# Patient Record
Sex: Female | Born: 1970 | Race: White | Hispanic: No | Marital: Married | Smoking: Never smoker
Health system: Southern US, Community
[De-identification: ages and names within clinical notes are randomized; demographics above are authoritative.]

---

## 1999-02-15 ENCOUNTER — Other Ambulatory Visit: Admission: RE | Admit: 1999-02-15 | Discharge: 1999-02-15 | Payer: Self-pay | Admitting: Obstetrics and Gynecology

## 1999-08-12 ENCOUNTER — Other Ambulatory Visit: Admission: RE | Admit: 1999-08-12 | Discharge: 1999-08-12 | Payer: Self-pay | Admitting: Obstetrics and Gynecology

## 2000-05-02 ENCOUNTER — Other Ambulatory Visit: Admission: RE | Admit: 2000-05-02 | Discharge: 2000-05-02 | Payer: Self-pay | Admitting: *Deleted

## 2000-11-03 ENCOUNTER — Other Ambulatory Visit: Admission: RE | Admit: 2000-11-03 | Discharge: 2000-11-03 | Payer: Self-pay | Admitting: Obstetrics and Gynecology

## 2002-02-12 ENCOUNTER — Other Ambulatory Visit: Admission: RE | Admit: 2002-02-12 | Discharge: 2002-02-12 | Payer: Self-pay | Admitting: *Deleted

## 2002-08-16 ENCOUNTER — Other Ambulatory Visit: Admission: RE | Admit: 2002-08-16 | Discharge: 2002-08-16 | Payer: Self-pay | Admitting: Obstetrics and Gynecology

## 2003-08-25 ENCOUNTER — Other Ambulatory Visit: Admission: RE | Admit: 2003-08-25 | Discharge: 2003-08-25 | Payer: Self-pay | Admitting: Obstetrics and Gynecology

## 2003-12-24 ENCOUNTER — Other Ambulatory Visit: Admission: RE | Admit: 2003-12-24 | Discharge: 2003-12-24 | Payer: Self-pay | Admitting: Dermatology

## 2004-08-14 ENCOUNTER — Inpatient Hospital Stay (HOSPITAL_COMMUNITY): Admission: AD | Admit: 2004-08-14 | Discharge: 2004-08-14 | Payer: Self-pay | Admitting: Obstetrics and Gynecology

## 2004-08-23 ENCOUNTER — Encounter (INDEPENDENT_AMBULATORY_CARE_PROVIDER_SITE_OTHER): Payer: Self-pay | Admitting: *Deleted

## 2004-08-23 ENCOUNTER — Inpatient Hospital Stay (HOSPITAL_COMMUNITY): Admission: AD | Admit: 2004-08-23 | Discharge: 2004-08-25 | Payer: Self-pay | Admitting: Obstetrics and Gynecology

## 2004-09-02 ENCOUNTER — Ambulatory Visit: Admission: RE | Admit: 2004-09-02 | Discharge: 2004-09-02 | Payer: Self-pay | Admitting: Obstetrics and Gynecology

## 2004-09-16 ENCOUNTER — Encounter: Admission: RE | Admit: 2004-09-16 | Discharge: 2004-10-16 | Payer: Self-pay | Admitting: Obstetrics and Gynecology

## 2004-10-04 ENCOUNTER — Other Ambulatory Visit: Admission: RE | Admit: 2004-10-04 | Discharge: 2004-10-04 | Payer: Self-pay | Admitting: Obstetrics and Gynecology

## 2004-11-01 ENCOUNTER — Ambulatory Visit: Payer: Self-pay | Admitting: Family Medicine

## 2005-08-18 ENCOUNTER — Ambulatory Visit (HOSPITAL_COMMUNITY): Admission: RE | Admit: 2005-08-18 | Discharge: 2005-08-18 | Payer: Self-pay | Admitting: Obstetrics and Gynecology

## 2005-09-13 ENCOUNTER — Other Ambulatory Visit: Admission: RE | Admit: 2005-09-13 | Discharge: 2005-09-13 | Payer: Self-pay | Admitting: Obstetrics and Gynecology

## 2006-02-15 ENCOUNTER — Ambulatory Visit: Payer: Self-pay | Admitting: Gynecology

## 2006-02-22 ENCOUNTER — Ambulatory Visit: Payer: Self-pay | Admitting: Gynecology

## 2006-03-01 ENCOUNTER — Ambulatory Visit: Payer: Self-pay | Admitting: Gynecology

## 2006-03-07 ENCOUNTER — Ambulatory Visit: Payer: Self-pay | Admitting: Obstetrics and Gynecology

## 2006-03-14 ENCOUNTER — Ambulatory Visit: Payer: Self-pay | Admitting: Obstetrics and Gynecology

## 2006-03-20 ENCOUNTER — Inpatient Hospital Stay (HOSPITAL_COMMUNITY): Admission: AD | Admit: 2006-03-20 | Discharge: 2006-03-20 | Payer: Self-pay | Admitting: Obstetrics and Gynecology

## 2006-03-21 ENCOUNTER — Ambulatory Visit: Payer: Self-pay | Admitting: Obstetrics and Gynecology

## 2006-03-28 ENCOUNTER — Inpatient Hospital Stay (HOSPITAL_COMMUNITY): Admission: AD | Admit: 2006-03-28 | Discharge: 2006-03-30 | Payer: Self-pay | Admitting: Obstetrics and Gynecology

## 2006-03-28 ENCOUNTER — Ambulatory Visit: Payer: Self-pay | Admitting: Obstetrics and Gynecology

## 2009-01-29 ENCOUNTER — Ambulatory Visit: Payer: Self-pay | Admitting: Sports Medicine

## 2009-01-29 DIAGNOSIS — M25559 Pain in unspecified hip: Secondary | ICD-10-CM | POA: Insufficient documentation

## 2009-01-29 DIAGNOSIS — M722 Plantar fascial fibromatosis: Secondary | ICD-10-CM

## 2009-01-30 DIAGNOSIS — M2170 Unequal limb length (acquired), unspecified site: Secondary | ICD-10-CM | POA: Insufficient documentation

## 2009-03-16 ENCOUNTER — Ambulatory Visit: Payer: Self-pay | Admitting: Sports Medicine

## 2009-04-02 ENCOUNTER — Encounter: Payer: Self-pay | Admitting: Sports Medicine

## 2009-04-14 ENCOUNTER — Ambulatory Visit: Payer: Self-pay | Admitting: Sports Medicine

## 2009-10-05 ENCOUNTER — Ambulatory Visit (HOSPITAL_COMMUNITY): Admission: RE | Admit: 2009-10-05 | Discharge: 2009-10-05 | Payer: Self-pay | Admitting: Obstetrics and Gynecology

## 2010-11-21 LAB — CBC
Hemoglobin: 14.5 g/dL (ref 12.0–15.0)
MCHC: 33.6 g/dL (ref 30.0–36.0)
RBC: 4.84 MIL/uL (ref 3.87–5.11)
WBC: 7 10*3/uL (ref 4.0–10.5)

## 2010-11-21 LAB — PREGNANCY, URINE: Preg Test, Ur: NEGATIVE

## 2011-04-15 ENCOUNTER — Ambulatory Visit (INDEPENDENT_AMBULATORY_CARE_PROVIDER_SITE_OTHER): Payer: Managed Care, Other (non HMO) | Admitting: Sports Medicine

## 2011-04-15 ENCOUNTER — Encounter: Payer: Self-pay | Admitting: Sports Medicine

## 2011-04-15 VITALS — BP 128/76 | HR 52 | Ht 65.5 in | Wt 140.0 lb

## 2011-04-15 DIAGNOSIS — M25562 Pain in left knee: Secondary | ICD-10-CM

## 2011-04-15 DIAGNOSIS — M25569 Pain in unspecified knee: Secondary | ICD-10-CM

## 2011-04-15 NOTE — Progress Notes (Signed)
  Subjective:    Patient ID: Christy Dennis, female    DOB: Jan 23, 1971, 40 y.o.   MRN: [id number]  HPI 1. Left knee pain. Patient has recently begun training for her first marathon. For past few weeks, noticing pain in her anterolateral left knee that starts after she reaches mile 7-9 on her long run days. At times it radiates to anterior tibial region, but not usually. This pain resolves after finishing her run and does not return until she reaches the same mileage in her next run. Also noticing some left great toe pain on lateral aspect that similarly onset when she increased her mileage, worsens with extension. Recently purchased new running shoes but this has not helped.  Has a history of patellofemoral syndrome and leg length discrepency.   Review of Systems Denies trauma, effusion, pain at rest, joint instability.    Objective:   Physical Exam  Vitals reviewed. Constitutional: She is oriented to person, place, and time. She appears well-developed and well-nourished. No distress.  HENT:  Head: Normocephalic and atraumatic.  Musculoskeletal: Normal range of motion.       Mild TTP over inferolateral aspect patella at patellar tendon margin. Full ROM bilaterally intact. Negative for ligamental laxity, no effusion, negative mcmurray testing.   On standing has slightly diminished L>R longitudinal plantar arches.  Left great toe with hallucis stiffness. No bony deformity.  Left hip abduction 4/5, right is 5/5.   Neurological: She is alert and oriented to person, place, and time. Coordination normal.   Running gait looks within normal limits        Assessment & Plan:

## 2011-04-15 NOTE — Assessment & Plan Note (Signed)
Likely overuse secondary to poor biomechanics with weak hip abduction and slightly flat left arch. Recommended daily hip abduction exercises and placed sport insoles for support in running shoes today. Follow up prn if symptoms not improved in next few weeks to consider custom orthotics.

## 2011-10-11 ENCOUNTER — Ambulatory Visit: Payer: Managed Care, Other (non HMO) | Admitting: Sports Medicine

## 2011-10-12 ENCOUNTER — Ambulatory Visit: Payer: Managed Care, Other (non HMO) | Admitting: Sports Medicine

## 2011-10-13 ENCOUNTER — Ambulatory Visit (INDEPENDENT_AMBULATORY_CARE_PROVIDER_SITE_OTHER): Payer: Managed Care, Other (non HMO) | Admitting: Sports Medicine

## 2011-10-13 VITALS — BP 108/62

## 2011-10-13 DIAGNOSIS — M2020 Hallux rigidus, unspecified foot: Secondary | ICD-10-CM

## 2011-10-13 DIAGNOSIS — M79672 Pain in left foot: Secondary | ICD-10-CM

## 2011-10-13 DIAGNOSIS — M79609 Pain in unspecified limb: Secondary | ICD-10-CM

## 2011-10-13 NOTE — Progress Notes (Signed)
  Subjective:    Patient ID: Christy [place], female    DOB: [birth year], 41 y.o.   MRN: [id number]  HPI  L foot pain- proximal arch, but not heel since the end of oct, but worsened in January. Ran [employer] marathon in Oct, has not been running as regularly since then.   We saw her approximately 3 years ago but her plantar fasciitis at that time was on the right. We did note her unequal leg length but she has some mild scoliosis which compensates so that her gait was very neutral.  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

## 2011-10-13 NOTE — Assessment & Plan Note (Addendum)
We will use scaphoid pads along with sports insoles and a arch strap.  Restart an exercise program to strengthen the arch and foot.  Recheck in 6 weeks. Should she not improve I do think we need to go ahead with custom orthotics since she has had some chronic foot pain bilaterally.

## 2011-11-23 ENCOUNTER — Ambulatory Visit: Payer: Managed Care, Other (non HMO) | Admitting: Sports Medicine

## 2011-11-28 ENCOUNTER — Other Ambulatory Visit: Payer: Self-pay | Admitting: Obstetrics and Gynecology

## 2011-11-28 DIAGNOSIS — N6009 Solitary cyst of unspecified breast: Secondary | ICD-10-CM

## 2011-11-30 ENCOUNTER — Ambulatory Visit
Admission: RE | Admit: 2011-11-30 | Discharge: 2011-11-30 | Disposition: A | Discharge status: Home / Self Care | Payer: Managed Care, Other (non HMO) | Source: Ambulatory Visit | Attending: Obstetrics and Gynecology | Admitting: Obstetrics and Gynecology

## 2011-11-30 DIAGNOSIS — N6009 Solitary cyst of unspecified breast: Secondary | ICD-10-CM

## 2011-12-14 ENCOUNTER — Ambulatory Visit: Payer: Managed Care, Other (non HMO) | Admitting: Sports Medicine

## 2012-03-22 ENCOUNTER — Ambulatory Visit: Payer: Managed Care, Other (non HMO) | Admitting: Sports Medicine

## 2012-05-17 ENCOUNTER — Other Ambulatory Visit: Payer: Self-pay | Admitting: Family Medicine

## 2012-05-17 ENCOUNTER — Ambulatory Visit
Admission: RE | Admit: 2012-05-17 | Discharge: 2012-05-17 | Disposition: A | Discharge status: Home / Self Care | Payer: 59 | Source: Ambulatory Visit | Attending: Family Medicine | Admitting: Family Medicine

## 2012-05-17 DIAGNOSIS — R1032 Left lower quadrant pain: Secondary | ICD-10-CM

## 2012-05-17 MED ORDER — IOHEXOL 300 MG/ML  SOLN
30.0000 mL | Freq: Once | INTRAMUSCULAR | Status: AC | PRN
  Administered 2012-05-17: 30 mL via ORAL

## 2012-05-17 MED ORDER — IOHEXOL 300 MG/ML  SOLN
100.0000 mL | Freq: Once | INTRAMUSCULAR | Status: AC | PRN
  Administered 2012-05-17: 100 mL via INTRAVENOUS

## 2012-11-14 ENCOUNTER — Other Ambulatory Visit: Payer: Self-pay | Admitting: Obstetrics and Gynecology

## 2012-11-30 ENCOUNTER — Ambulatory Visit
Admission: RE | Admit: 2012-11-30 | Discharge: 2012-11-30 | Disposition: A | Discharge status: Home / Self Care | Payer: 59 | Source: Ambulatory Visit | Attending: Obstetrics and Gynecology | Admitting: Obstetrics and Gynecology

## 2012-11-30 ENCOUNTER — Other Ambulatory Visit: Payer: Self-pay | Admitting: Obstetrics and Gynecology

## 2012-11-30 DIAGNOSIS — N644 Mastodynia: Secondary | ICD-10-CM

## 2012-12-18 ENCOUNTER — Other Ambulatory Visit: Payer: Self-pay | Admitting: Obstetrics and Gynecology

## 2013-11-20 ENCOUNTER — Other Ambulatory Visit: Payer: Self-pay

## 2013-11-20 DIAGNOSIS — Z1231 Encounter for screening mammogram for malignant neoplasm of breast: Secondary | ICD-10-CM

## 2013-12-17 ENCOUNTER — Ambulatory Visit
Admission: RE | Admit: 2013-12-17 | Discharge: 2013-12-17 | Disposition: A | Discharge status: Home / Self Care | Payer: Managed Care, Other (non HMO) | Source: Ambulatory Visit

## 2013-12-17 DIAGNOSIS — Z1231 Encounter for screening mammogram for malignant neoplasm of breast: Secondary | ICD-10-CM

## 2014-02-07 ENCOUNTER — Other Ambulatory Visit: Payer: Self-pay | Admitting: Obstetrics and Gynecology

## 2014-02-12 LAB — CYTOLOGY - PAP

## 2015-01-02 ENCOUNTER — Other Ambulatory Visit (HOSPITAL_COMMUNITY): Payer: Self-pay | Admitting: Ophthalmology

## 2015-01-02 DIAGNOSIS — H539 Unspecified visual disturbance: Secondary | ICD-10-CM

## 2015-01-08 ENCOUNTER — Ambulatory Visit (HOSPITAL_COMMUNITY): Admission: RE | Admit: 2015-01-08 | Payer: Managed Care, Other (non HMO) | Source: Ambulatory Visit

## 2015-01-28 ENCOUNTER — Other Ambulatory Visit: Payer: Self-pay

## 2015-01-28 DIAGNOSIS — Z1231 Encounter for screening mammogram for malignant neoplasm of breast: Secondary | ICD-10-CM

## 2015-03-02 ENCOUNTER — Ambulatory Visit
Admission: RE | Admit: 2015-03-02 | Discharge: 2015-03-02 | Disposition: A | Discharge status: Home / Self Care | Payer: Managed Care, Other (non HMO) | Source: Ambulatory Visit

## 2015-03-02 DIAGNOSIS — Z1231 Encounter for screening mammogram for malignant neoplasm of breast: Secondary | ICD-10-CM

## 2015-04-09 ENCOUNTER — Other Ambulatory Visit: Payer: Self-pay | Admitting: Obstetrics and Gynecology

## 2015-04-10 LAB — CYTOLOGY - PAP

## 2016-03-03 ENCOUNTER — Other Ambulatory Visit: Payer: Self-pay | Admitting: Obstetrics and Gynecology

## 2016-03-03 DIAGNOSIS — Z1231 Encounter for screening mammogram for malignant neoplasm of breast: Secondary | ICD-10-CM

## 2016-03-07 ENCOUNTER — Ambulatory Visit
Admission: RE | Admit: 2016-03-07 | Discharge: 2016-03-07 | Disposition: A | Discharge status: Home / Self Care | Payer: Managed Care, Other (non HMO) | Source: Ambulatory Visit | Attending: Obstetrics and Gynecology | Admitting: Obstetrics and Gynecology

## 2016-03-07 DIAGNOSIS — Z1231 Encounter for screening mammogram for malignant neoplasm of breast: Secondary | ICD-10-CM

## 2016-03-10 ENCOUNTER — Other Ambulatory Visit: Payer: Self-pay | Admitting: Obstetrics and Gynecology

## 2016-03-10 DIAGNOSIS — R928 Other abnormal and inconclusive findings on diagnostic imaging of breast: Secondary | ICD-10-CM

## 2016-03-17 ENCOUNTER — Ambulatory Visit
Admission: RE | Admit: 2016-03-17 | Discharge: 2016-03-17 | Disposition: A | Discharge status: Home / Self Care | Payer: Managed Care, Other (non HMO) | Source: Ambulatory Visit | Attending: Obstetrics and Gynecology | Admitting: Obstetrics and Gynecology

## 2016-03-17 DIAGNOSIS — R928 Other abnormal and inconclusive findings on diagnostic imaging of breast: Secondary | ICD-10-CM

## 2016-06-15 ENCOUNTER — Telehealth: Payer: Self-pay | Admitting: Pediatrics

## 2016-06-15 NOTE — Telephone Encounter (Addendum)
Called mom and left message to call the office to set intake  with Dr.Lewis .

## 2016-06-27 ENCOUNTER — Ambulatory Visit (INDEPENDENT_AMBULATORY_CARE_PROVIDER_SITE_OTHER): Payer: Managed Care, Other (non HMO) | Admitting: Podiatry

## 2016-06-27 ENCOUNTER — Ambulatory Visit: Payer: Managed Care, Other (non HMO)

## 2016-06-27 ENCOUNTER — Ambulatory Visit (INDEPENDENT_AMBULATORY_CARE_PROVIDER_SITE_OTHER): Payer: Managed Care, Other (non HMO)

## 2016-06-27 ENCOUNTER — Encounter: Payer: Self-pay | Admitting: Podiatry

## 2016-06-27 VITALS — BP 110/74 | HR 74 | Resp 16 | Ht 65.0 in | Wt 160.0 lb

## 2016-06-27 DIAGNOSIS — M2020 Hallux rigidus, unspecified foot: Secondary | ICD-10-CM

## 2016-06-27 DIAGNOSIS — M79672 Pain in left foot: Secondary | ICD-10-CM

## 2016-06-27 DIAGNOSIS — M79671 Pain in right foot: Secondary | ICD-10-CM

## 2016-06-27 DIAGNOSIS — M779 Enthesopathy, unspecified: Secondary | ICD-10-CM | POA: Diagnosis not present

## 2016-06-27 NOTE — Progress Notes (Signed)
   Subjective:    Patient ID: Christy [place], female    DOB: 1970-12-04, 45 y.o.   MRN: [id number]  HPI Chief Complaint  Patient presents with  . Toe Pain    Bilateral; great toes-joint; pt stated, "Has more pain in the right foot; Had MRI done on Right foot from Dr. Sherene SiresWainer's office on Friday, June 24, 2016"      Review of Systems  Musculoskeletal: Positive for arthralgias.  All other systems reviewed and are negative.      Objective:   Physical Exam        Assessment & Plan:

## 2016-06-27 NOTE — Patient Instructions (Addendum)
Hallux Rigidus Hallux rigidus is a condition involving pain and a loss of motion of the first (big) toe. The pain gets worse with lifting up (extension) of the toe. This is usually due to arthritic bony bumps (spurring) of the joint at the base of the big toe.  SYMPTOMS   Pain, with lifting up of the toe.  Tenderness over the joint where the big toe meets the foot.  Redness, swelling, and warmth over the top of the base of the big toe (sometimes).  Foot pain, stiffness, and limping. CAUSES  Hallux rigidus is caused by arthritis of the joint where the big toe meets the foot. The arthritis creates a bone spur that pinches the soft tissues when the toe is extended. RISK INCREASES WITH:  Tight shoes with a narrow toe box.  Family history of foot problems.  Gout and rheumatoid and psoriatic arthritis.  History of previous toe injury, including "turf toe."  Long first toe, flat feet, and other big toe bony bumps.  Arthritis of the big toe. PREVENTION   Wear wide-toed shoes that fit well.  Tape the big toe to reduce motion and to prevent pinching of the tissues between the bone.  Maintain physical fitness:  Foot and ankle flexibility.  Muscle strength and endurance. PROGNOSIS  This condition can usually be managed with proper treatment. However, surgery is typically required to prevent the problem from recurring.  RELATED COMPLICATIONS  Injury to other areas of the foot or ankle, caused by abnormal walking in an attempt to avoid the pain felt when walking normally. TREATMENT Treatment first involves stopping the activities that aggravate your symptoms. Ice and medicine can be used to reduce the pain and inflammation. Modifications to shoes may help reduce pain, including wearing stiff-soled shoes, shoes with a wide toe box, inserting a padded donut to relieve pressure on top of the joint, or wearing an arch support. Corticosteroid injections may be given to reduce inflammation. If  nonsurgical treatment is unsuccessful, surgery may be needed. Surgical options include removing the arthritic bony spur, cutting a bone in the foot to change the arc of motion (allowing the toe to extend more), or fusion of the joint (eliminating all motion in the joint at the base of the big toe).  MEDICATION   If pain medicine is needed, nonsteroidal anti-inflammatory medicines (aspirin and ibuprofen), or other minor pain relievers (acetaminophen), are often advised.  Do not take pain medicine for 7 days before surgery.  Prescription pain relievers are usually prescribed only after surgery. Use only as directed and only as much as you need.  Ointments for arthritis, applied to the skin, may give some relief.  Injections of corticosteroids may be given to reduce inflammation. HEAT AND COLD  Cold treatment (icing) relieves pain and reduces inflammation. Cold treatment should be applied for 10 to 15 minutes every 2 to 3 hours, and immediately after activity that aggravates your symptoms. Use ice packs or an ice massage.  Heat treatment may be used before performing the stretching and strengthening activities prescribed by your caregiver, physical therapist, or athletic trainer. Use a heat pack or a warm water soak. SEEK MEDICAL CARE IF:   Symptoms get worse or do not improve in 2 weeks, despite treatment.  After surgery you develop fever, increasing pain, redness, swelling, drainage of fluids, bleeding, or increasing warmth.  New, unexplained symptoms develop. (Drugs used in treatment may produce side effects.)   This information is not intended to replace advice given to   you by your health care provider. Make sure you discuss any questions you have with your health care provider.   Document Released: 08/22/2005 Document Revised: 09/12/2014 Document Reviewed: 12/04/2008 Elsevier Interactive Patient Education 2016 [employer]   Pre-Operative Instructions  Congratulations, you have  decided to take an important step to improving your quality of life.  You can be assured that the doctors of [facility] will be with you every step of the way.  1. Plan to be at the surgery center/hospital at least 1 (one) hour prior to your scheduled time unless otherwise directed by the surgical center/hospital staff.  You must have a responsible adult accompany you, remain during the surgery and drive you home.  Make sure you have directions to the surgical center/hospital and know how to get there on time. 2. For hospital based surgery you will need to obtain a history and physical form from your family physician within 1 month prior to the date of surgery- we will give you a form for you primary physician.  3. We make every effort to accommodate the date you request for surgery.  There are however, times where surgery dates or times have to be moved.  We will contact you as soon as possible if a change in schedule is required.   4. No Aspirin/Ibuprofen for one week before surgery.  If you are on aspirin, any non-steroidal anti-inflammatory medications (Mobic, Aleve, Ibuprofen) you should stop taking it 7 days prior to your surgery.  You make take Tylenol  For pain prior to surgery.  5. Medications- If you are taking daily heart and blood pressure medications, seizure, reflux, allergy, asthma, anxiety, pain or diabetes medications, make sure the surgery center/hospital is aware before the day of surgery so they may notify you which medications to take or avoid the day of surgery. 6. No food or drink after midnight the night before surgery unless directed otherwise by surgical center/hospital staff. 7. No alcoholic beverages 24 hours prior to surgery.  No smoking 24 hours prior to or 24 hours after surgery. 8. Wear loose pants or shorts- loose enough to fit over bandages, boots, and casts. 9. No slip on shoes, sneakers are best. 10. Bring your boot with you to the surgery center/hospital.  Also  bring crutches or a walker if your physician has prescribed it for you.  If you do not have this equipment, it will be provided for you after surgery. 11. If you have not been contracted by the surgery center/hospital by the day before your surgery, call to confirm the date and time of your surgery. 12. Leave-time from work may vary depending on the type of surgery you have.  Appropriate arrangements should be made prior to surgery with your employer. 13. Prescriptions will be provided immediately following surgery by your doctor.  Have these filled as soon as possible after surgery and take the medication as directed. 14. Remove nail polish on the operative foot. 15. Wash the night before surgery.  The night before surgery wash the foot and leg well with the antibacterial soap provided and water paying special attention to beneath the toenails and in between the toes.  Rinse thoroughly with water and dry well with a towel.  Perform this wash unless told not to do so by your physician.  Enclosed: 1 Ice pack (please put in freezer the night before surgery)   1 Hibiclens skin cleaner   Pre-op Instructions  If you have any questions regarding the  instructions, do not hesitate to call our office.  [place]: [address] [place], [place] [address] [phone]  [place]: [address], [place], [place] [address] [phone]  [place]: [address], [place] [address] [phone]   Dr. Cristie Hem DPM, Dr. Ovid Curd DPM, Dr. Ardelle Anton DPM, Dr. Asencion Islam DPM

## 2016-06-27 NOTE — Progress Notes (Signed)
Subjective:     Patient ID: Christy [place], female   DOB: March 23, 1971, 45 y.o.   MRN: [id number]  HPI patient presents stating that she's had a lot of problems with her big toe joint and she had it looked at approximate 3 years ago but it's gotten worse. Also is having some pain on top of her right foot and feels like the joint of her right big toe also bothers her at times more recently she's tried shoe gear modifications and reduced activity without relief   Review of Systems  All other systems reviewed and are negative.      Objective:   Physical Exam  Constitutional: She is oriented to person, place, and time.  Cardiovascular: Intact distal pulses.   Musculoskeletal: Normal range of motion.  Neurological: She is oriented to person, place, and time.  Skin: Skin is warm.  Nursing note and vitals reviewed.  neurovascular status intact muscle strength adequate range of motion within normal limits with patient noted to have hyperostosis on the dorsal and dorsal medial aspect first metatarsal head left over right with mild narrowing of the joint surface and significant reduction of motion first MPJ left. 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 At this point to try to stabilize the right one I've recommended orthotics and scanned for orthotics and discussed correction of left foot which I do think will be necessary. Patient wants surgery and will require removal of spurring along with possible decompression osteotomy and is scheduled for December and will reappoint for consult discussed in greater detail  X-ray report indicates that there is some spurring of the dorsal left first  metatarsal with narrowing of the joint surface and the right shows the beginning of spurring with elevated first metatarsal segment

## 2016-07-18 ENCOUNTER — Telehealth: Payer: Self-pay | Admitting: *Deleted

## 2016-07-18 ENCOUNTER — Ambulatory Visit (INDEPENDENT_AMBULATORY_CARE_PROVIDER_SITE_OTHER): Payer: Managed Care, Other (non HMO) | Admitting: Podiatry

## 2016-07-18 ENCOUNTER — Encounter: Payer: Self-pay | Admitting: Podiatry

## 2016-07-18 VITALS — BP 138/88 | HR 73 | Resp 16

## 2016-07-18 DIAGNOSIS — M2020 Hallux rigidus, unspecified foot: Secondary | ICD-10-CM

## 2016-07-18 DIAGNOSIS — M779 Enthesopathy, unspecified: Secondary | ICD-10-CM | POA: Diagnosis not present

## 2016-07-18 MED ORDER — TRIAMCINOLONE ACETONIDE 10 MG/ML IJ SUSP
10.0000 mg | Freq: Once | INTRAMUSCULAR | Status: AC
  Administered 2016-07-18: 10 mg

## 2016-07-18 NOTE — Patient Instructions (Addendum)
WEARING INSTRUCTIONS FOR ORTHOTICS  Don't expect to be comfortable wearing your orthotic devices for the first time.  Like eyeglasses, you may be aware of them as time passes, they will not be uncomfortable and you will enjoy wearing them.  FOLLOW THESE INSTRUCTIONS EXACTLY!  1. Wear your orthotic devices for:       Not more than 1 hour the first day.       Not more than 2 hours the second day.       Not more than 3 hours the third day and so on.        Or wear them for as long as they feel comfortable.       If you experience discomfort in your feet or legs take them out.  When feet & legs feel       better, put them back in.  You do need to be consistent and wear them a little        everyday. 2.   If at any time the orthotic devices become acutely uncomfortable before the       time for that particular day, STOP WEARING THEM. 3.   On the next day, do not increase the wearing time. 4.   Subsequently, increase the wearing time by 15-30 minutes only if comfortable to do       so. 5.   You will be seen by your doctor about 2-4 weeks after you receive your orthotic       devices, at which time you will probably be wearing your devices comfortably        for about 8 hours or more a day. 6.   Some patients occasionally report mild aches or discomfort in other parts of the of       body such as the knees, hips or back after 3 or 4 consecutive hours of wear.  If this       is the case with you, do not extend your wearing time.  Instead, cut it back an hour or       two.  In all likelihood, these symptoms will disappear in a short period of time as your       body posture realigns itself and functions more efficiently. 7.   It is possible that your orthotic device may require some small changes or adjustment       to improve their function or make them more comfortable.   This is usually not done       before one to three months have elapsed.  These adjustments are made in        accordance  with the changed position your feet are assuming as a result of       improved biomechanical function. 8.   In women's shoes, it's not unusual for your heel to slip out of the shoe, particularly if       they are step-in-shoes.  If this is the case, try other shoes or other styles.  Try to       purchase shoes which have deeper heal seats or higher heel counters. 9.   Squeaking of orthotics devices in the shoes is due to the movement of the devices       when they are functioning normally.  To eliminate squeaking, simply dust some       baby powder into your shoes before inserting the devices.  If this does not work,          apply soap or wax to the edges of the orthotic devices or put a tissue into the shoes. 10. It is important that you follow these directions explicitly.  Failure to do so will simply       prolong the adjustment period or create problems which are easily avoided.  It makes       no difference if you are wearing your orthotic devices for only a few hours after        several months, so long as you are wearing them comfortably for those hours. 11. If you have any questions or complaints, contact our office.  We have no way of       knowing about your problems unless you tell us.  If we do not hear from you, we will       assume that you are proceeding well.   Pre-Operative Instructions  Congratulations, you have decided to take an important step to improving your quality of life.  You can be assured that the doctors of [facility] will be with you every step of the way.  2. Plan to be at the surgery center/hospital at least 1 (one) hour prior to your scheduled time unless otherwise directed by the surgical center/hospital staff.  You must have a responsible adult accompany you, remain during the surgery and drive you home.  Make sure you have directions to the surgical center/hospital and know how to get there on time. 3. For hospital based surgery you will need to  obtain a history and physical form from your family physician within 1 month prior to the date of surgery- we will give you a form for you primary physician.  4. We make every effort to accommodate the date you request for surgery.  There are however, times where surgery dates or times have to be moved.  We will contact you as soon as possible if a change in schedule is required.   5. No Aspirin/Ibuprofen for one week before surgery.  If you are on aspirin, any non-steroidal anti-inflammatory medications (Mobic, Aleve, Ibuprofen) you should stop taking it 7 days prior to your surgery.  You make take Tylenol  For pain prior to surgery.  6. Medications- If you are taking daily heart and blood pressure medications, seizure, reflux, allergy, asthma, anxiety, pain or diabetes medications, make sure the surgery center/hospital is aware before the day of surgery so they may notify you which medications to take or avoid the day of surgery. 7. No food or drink after midnight the night before surgery unless directed otherwise by surgical center/hospital staff. 8. No alcoholic beverages 24 hours prior to surgery.  No smoking 24 hours prior to or 24 hours after surgery. 9. Wear loose pants or shorts- loose enough to fit over bandages, boots, and casts. 10. No slip on shoes, sneakers are best. 11. Bring your boot with you to the surgery center/hospital.  Also bring crutches or a walker if your physician has prescribed it for you.  If you do not have this equipment, it will be provided for you after surgery. 12. If you have not been contracted by the surgery center/hospital by the day before your surgery, call to confirm the date and time of your surgery. 13. Leave-time from work may vary depending on the type of surgery you have.  Appropriate arrangements should be made prior to surgery with your employer. 14. Prescriptions will be provided immediately following surgery by your doctor.  Have these filled   as soon as  possible after surgery and take the medication as directed. 15. Remove nail polish on the operative foot. 16. Wash the night before surgery.  The night before surgery wash the foot and leg well with the antibacterial soap provided and water paying special attention to beneath the toenails and in between the toes.  Rinse thoroughly with water and dry well with a towel.  Perform this wash unless told not to do so by your physician.  Enclosed: 1 Ice pack (please put in freezer the night before surgery)   1 Hibiclens skin cleaner   Pre-op Instructions  If you have any questions regarding the instructions, do not hesitate to call our office.  [place]: [address] [place], [place] [address] [phone]  [place]: [address] [place], [place] [address] [phone]  [place]: [address]  [place], [place] [address] [phone]   Dr. Norman Regal DPM, Dr. Matthew Wagoner DPM, Dr. M. Todd Hyatt DPM, Dr. Titorya Stover DPM 

## 2016-07-18 NOTE — Telephone Encounter (Signed)
Pt states she received an injection today and wanted to know what it was for and how long her toes would be numb. I told pt the injection was a steroid and it has antiinflammatory properties and the numbness may gradually dissipate inf 4-6 hours or longer, I told her she may have a steroid flare if so, take OTC antiinflammatory she tolerates as package instructs and ice 3-4 times daily for 15-7120minutes each session with skin protected from the ice with cloth. Pt states understanding.

## 2016-07-18 NOTE — Progress Notes (Signed)
Subjective:     Patient ID: Christy [place], female   DOB: 09-Jan-1971, 45 y.o.   MRN: [id number]  HPI patient states she wants to get her left foot fixed but she's having a lot of problems with the right ankle and she wants that better first. Admits that's been around for a long time but she was seen in orthopedic doctor and is had an MRI which did not indicate any tear   Review of Systems  All other systems reviewed and are negative.      Objective:   Physical Exam  Constitutional: She is oriented to person, place, and time.  Cardiovascular: Intact distal pulses.   Musculoskeletal: Normal range of motion.  Neurological: She is oriented to person, place, and time.  Skin: Skin is warm.  Nursing note and vitals reviewed.  neurovascular status intact muscle strength adequate range of motion within normal limits with patient found to have discomfort around the posterior tibial tendon as it comes under the medial malleolus right with no indication of tendon dysfunction or tear. Patient has continued significant hallux limitus deformity left     Assessment:     Probable posterior tibial tendinitis right with hallux limitus deformity left over right    Plan:     Reviewed condition and at this time I recommended treatment for the right as it's never been treated. I went ahead did careful sheath injection right 3 mg Kenalog 5 mill grams Xylocaine along the sheath of the tendon and applied air fracture walker for complete immobilization. She will begin orthotics to take stress off her feet will be seen back in 2 weeks and most likely we will delay the surgery on her left foot depending how she responds on her right foot

## 2016-07-21 ENCOUNTER — Ambulatory Visit (INDEPENDENT_AMBULATORY_CARE_PROVIDER_SITE_OTHER): Payer: Managed Care, Other (non HMO) | Admitting: Orthopedic Surgery

## 2016-08-02 ENCOUNTER — Ambulatory Visit: Payer: Managed Care, Other (non HMO) | Admitting: Sports Medicine

## 2016-08-03 ENCOUNTER — Encounter: Payer: Self-pay | Admitting: Podiatry

## 2016-08-03 ENCOUNTER — Ambulatory Visit (INDEPENDENT_AMBULATORY_CARE_PROVIDER_SITE_OTHER): Payer: Managed Care, Other (non HMO) | Admitting: Podiatry

## 2016-08-03 DIAGNOSIS — M2020 Hallux rigidus, unspecified foot: Secondary | ICD-10-CM

## 2016-08-03 DIAGNOSIS — M779 Enthesopathy, unspecified: Secondary | ICD-10-CM

## 2016-08-03 NOTE — Progress Notes (Signed)
Subjective:     Patient ID: Christy [place], female   DOB: [birth year], 45 y.o.   MRN: [id number]  HPI patient states that she's improved but her foot was tender over a couple day.   Review of Systems     Objective:   Physical Exam Neurovascular status intact muscle strength adequate with patient right posterior tib showing quite a bit of improvement with diminished edema diminished swelling diminished pain. The left foot continues to exhibit significant hallux limitus symptoms over the right foot with pain that she knows needs to be corrected but she wants to wait until January    Assessment:     Posterior tibial tendinitis right with hallux limitus deformity left over right    Plan:     H&P conditions reviewed and recommended continued immobilization and heat and ice therapy for the right and utilization of Ace wrap. For the left she wants to have surgery in January and we'll look at her calendar and schedule date and we'll see her back prior to go over in greater detail. She will wear orthotics much as possible and will be seen back when she's ready for surgery left ORIF the right should become symptomatic

## 2017-06-08 IMAGING — US ULTRASOUND LEFT BREAST LIMITED
1 series · 5 of 5 positions shown · non-contrast
Comparison: Previous exam(s).

CLINICAL DATA: Possible mass in the upper inner retroareolar left
breast on a recent screening mammogram.

EXAM:
DIGITAL DIAGNOSTIC LEFT MAMMOGRAM
ULTRASOUND LEFT BREAST

[Series 1: ultrasound left breast limited · 0.07mm/px · 5 of 5 slices shown]
[im 1/5]
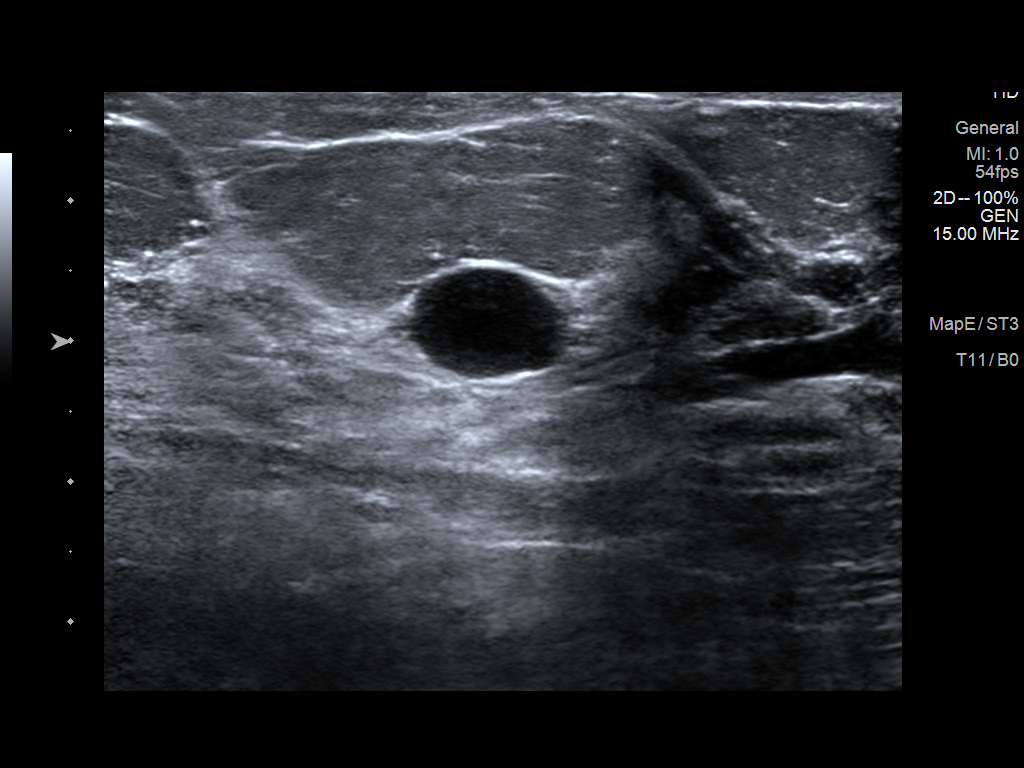
[im 2/5]
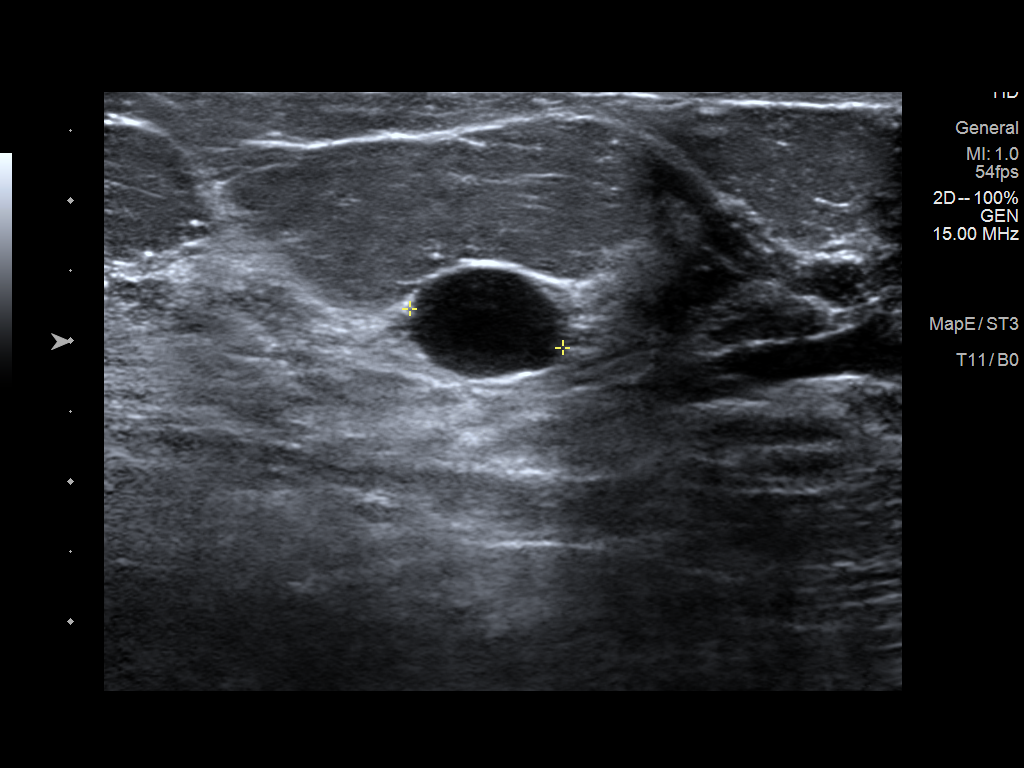
[im 3/5]
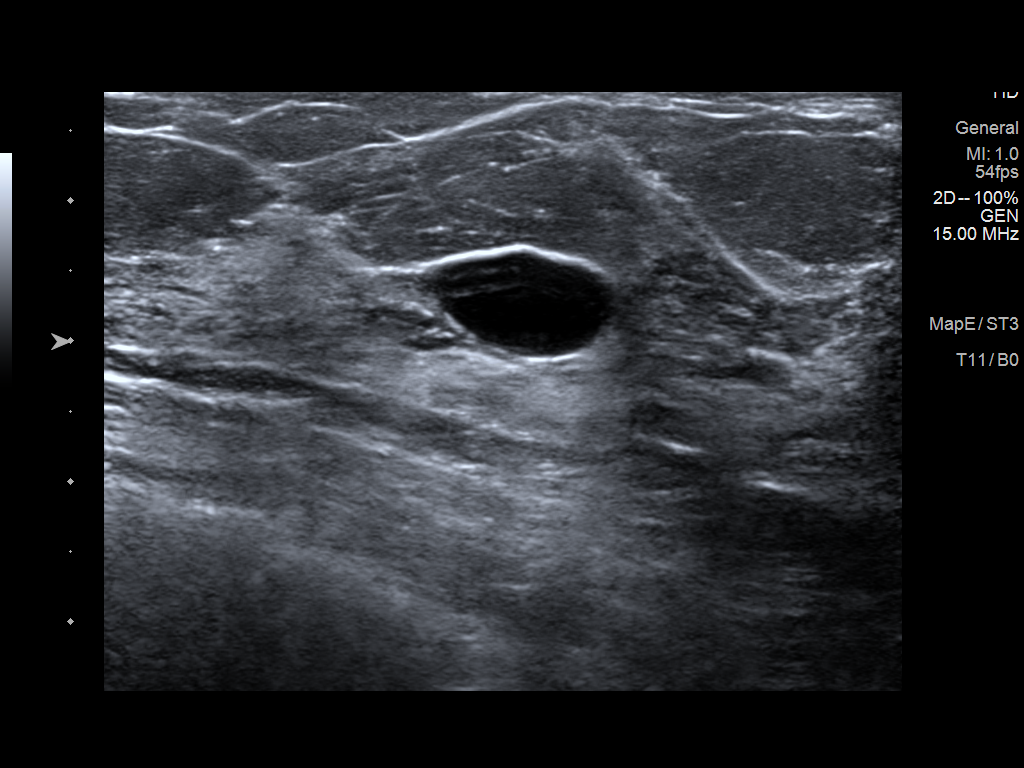
[im 4/5]
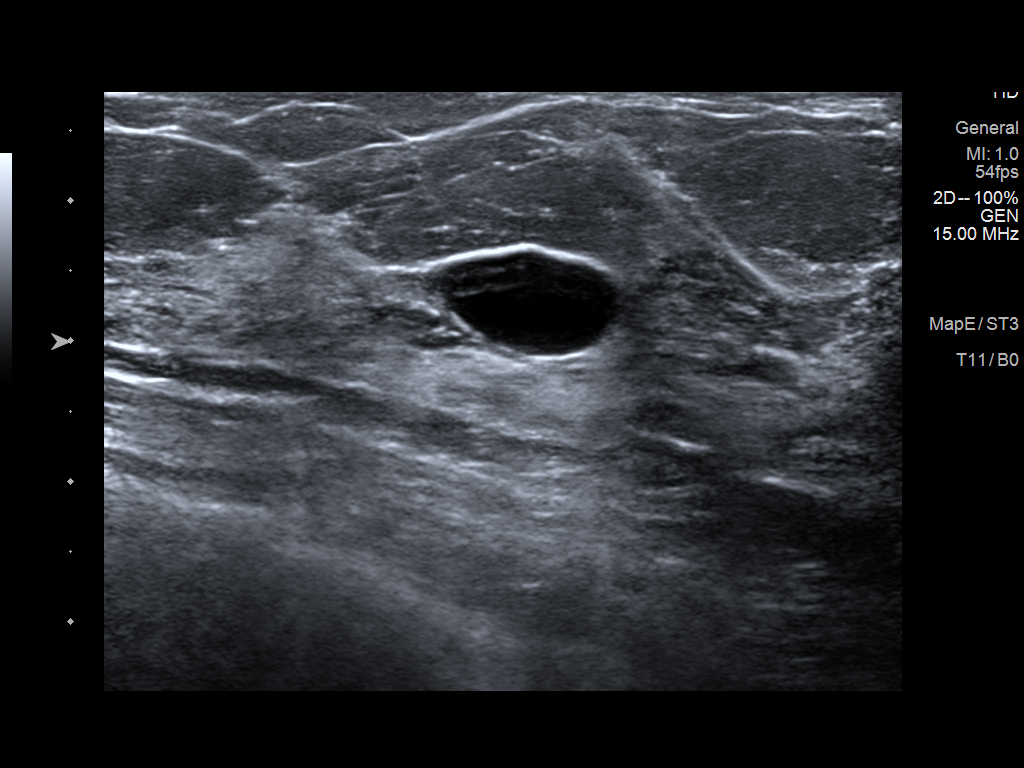
[im 5/5]
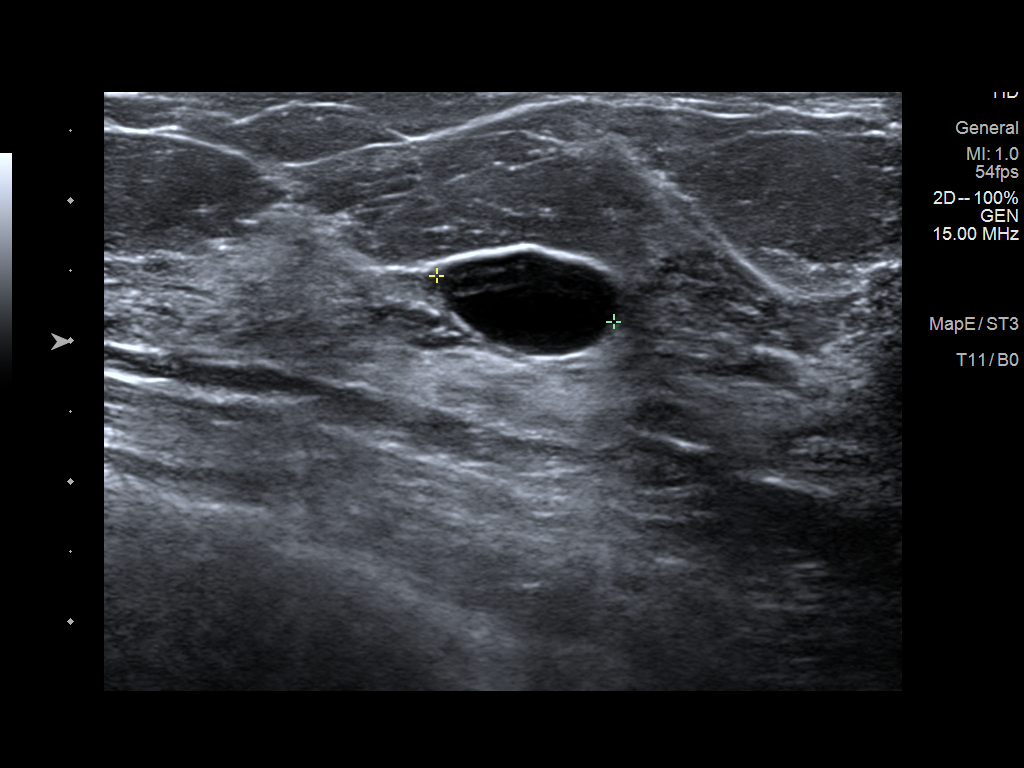

[5 of 5 positions shown; findings below may reference images not displayed]

ACR Breast Density Category c: The breast tissue is heterogeneously
dense, which may obscure small masses.
FINDINGS: 2D and 3D spot compression views of the left breast confirm an oval,
circumscribed mass with partially obscured margins in the upper
inner retroareolar left breast.

On physical exam, no mass is palpable in the upper inner
retroareolar left breast.

Targeted ultrasound is performed, showing a 1.3 cm cyst in the 11
o'clock position of the left breast, 2 cm from the nipple,
corresponding to the mammographic mass.
IMPRESSION: Left breast cyst.  No evidence of malignancy.

RECOMMENDATION:
Bilateral screening mammogram in 1 year.

I have discussed the findings and recommendations with the patient.
Results were also provided in writing at the conclusion of the
visit. If applicable, a reminder letter will be sent to the patient
regarding the next appointment.

BI-RADS CATEGORY  2: Benign.

## 2017-06-08 IMAGING — MG 2D DIGITAL DIAGNOSTIC UNILATERAL LEFT MAMMOGRAM WITH CAD AND ADJ
4 series · 4 of 12 positions shown · non-contrast
Comparison: Previous exam(s).

CLINICAL DATA: Possible mass in the upper inner retroareolar left
breast on a recent screening mammogram.

EXAM:
DIGITAL DIAGNOSTIC LEFT MAMMOGRAM
ULTRASOUND LEFT BREAST

[L CC]
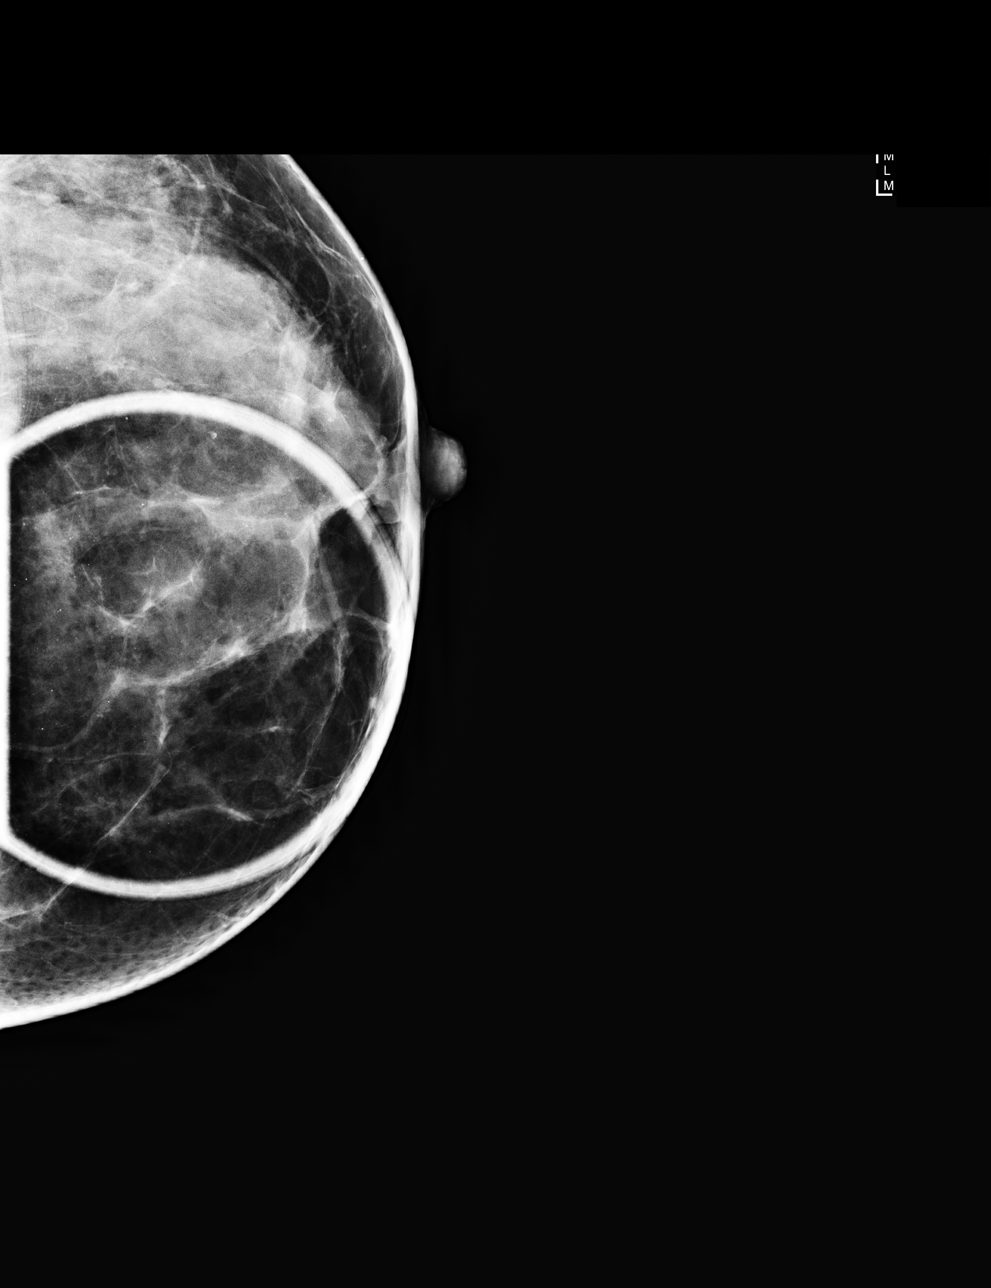

[L MLO]
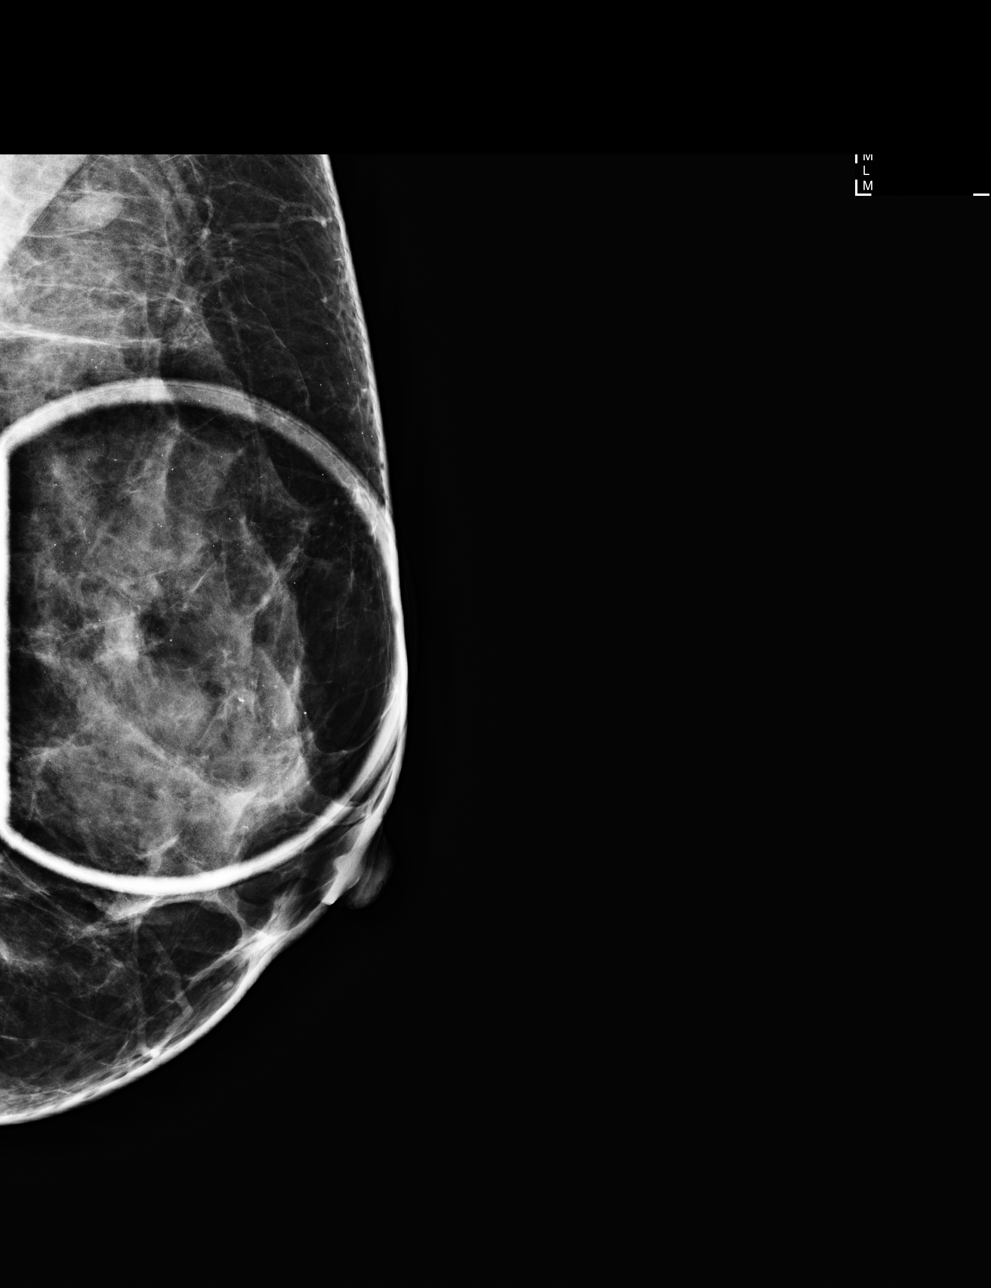

[L CC tomo · tomo slice 26/51.0]
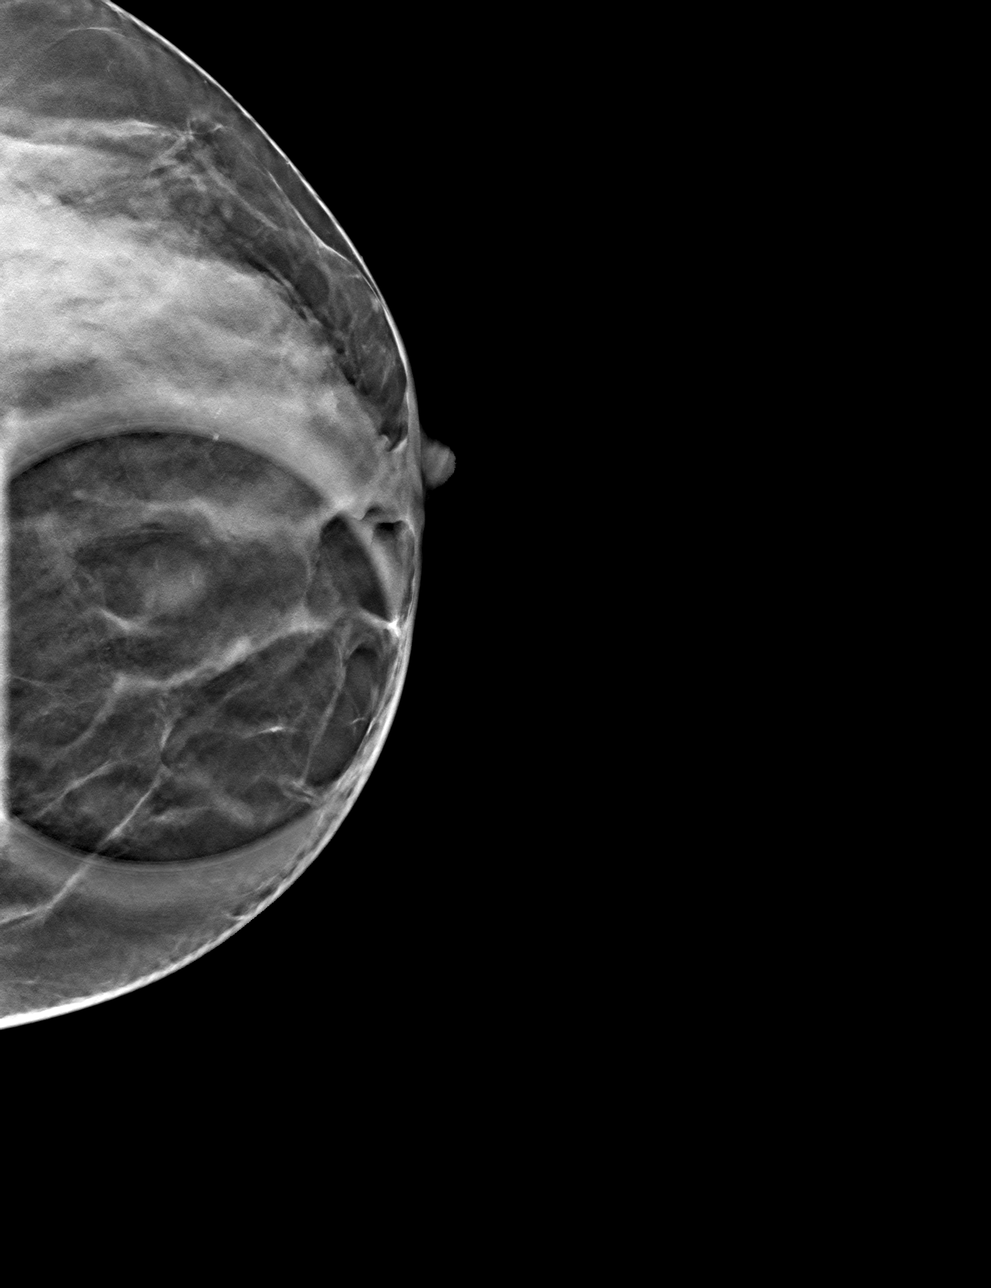

[L MLO tomo · tomo slice 30/59.0]
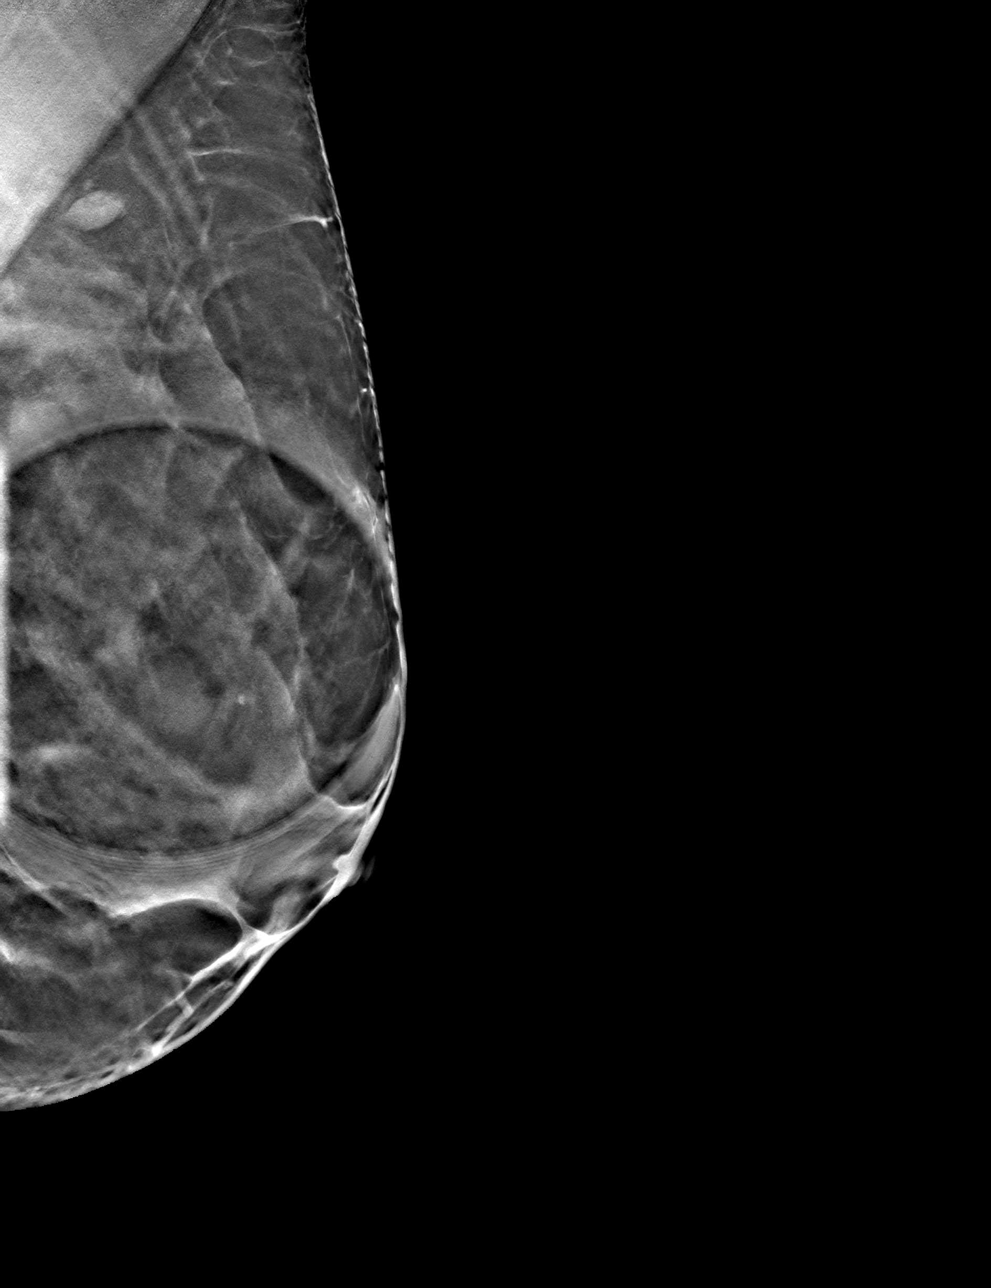

[4 of 12 positions shown; findings below may reference images not displayed]

ACR Breast Density Category c: The breast tissue is heterogeneously
dense, which may obscure small masses.
FINDINGS: 2D and 3D spot compression views of the left breast confirm an oval,
circumscribed mass with partially obscured margins in the upper
inner retroareolar left breast.

On physical exam, no mass is palpable in the upper inner
retroareolar left breast.

Targeted ultrasound is performed, showing a 1.3 cm cyst in the 11
o'clock position of the left breast, 2 cm from the nipple,
corresponding to the mammographic mass.
IMPRESSION: Left breast cyst.  No evidence of malignancy.

RECOMMENDATION:
Bilateral screening mammogram in 1 year.

I have discussed the findings and recommendations with the patient.
Results were also provided in writing at the conclusion of the
visit. If applicable, a reminder letter will be sent to the patient
regarding the next appointment.

BI-RADS CATEGORY  2: Benign.

## 2023-05-17 ENCOUNTER — Other Ambulatory Visit: Payer: Self-pay | Admitting: Obstetrics and Gynecology

## 2023-05-17 DIAGNOSIS — R928 Other abnormal and inconclusive findings on diagnostic imaging of breast: Secondary | ICD-10-CM

## 2023-05-24 ENCOUNTER — Ambulatory Visit
Admission: RE | Admit: 2023-05-24 | Discharge: 2023-05-24 | Disposition: A | Discharge status: Home / Self Care | Payer: Managed Care, Other (non HMO) | Source: Ambulatory Visit | Attending: Obstetrics and Gynecology | Admitting: Obstetrics and Gynecology

## 2023-05-24 ENCOUNTER — Other Ambulatory Visit: Payer: Self-pay | Admitting: Obstetrics and Gynecology

## 2023-05-24 DIAGNOSIS — R928 Other abnormal and inconclusive findings on diagnostic imaging of breast: Secondary | ICD-10-CM

## 2023-05-24 DIAGNOSIS — N6320 Unspecified lump in the left breast, unspecified quadrant: Secondary | ICD-10-CM

## 2023-05-24 HISTORY — PX: BREAST BIOPSY: SHX20

## 2023-05-25 LAB — SURGICAL PATHOLOGY
